# Patient Record
Sex: Female | Born: 1950 | Race: White | Hispanic: No | Marital: Married | Smoking: Never smoker
Health system: Southern US, Community
[De-identification: ages and names within clinical notes are randomized; demographics above are authoritative.]

## PROBLEM LIST (undated history)

## (undated) DIAGNOSIS — K649 Unspecified hemorrhoids: Secondary | ICD-10-CM

## (undated) DIAGNOSIS — R112 Nausea with vomiting, unspecified: Secondary | ICD-10-CM

## (undated) DIAGNOSIS — E039 Hypothyroidism, unspecified: Secondary | ICD-10-CM

## (undated) DIAGNOSIS — Z9889 Other specified postprocedural states: Secondary | ICD-10-CM

## (undated) DIAGNOSIS — I10 Essential (primary) hypertension: Secondary | ICD-10-CM

## (undated) DIAGNOSIS — R002 Palpitations: Secondary | ICD-10-CM

## (undated) DIAGNOSIS — J189 Pneumonia, unspecified organism: Secondary | ICD-10-CM

## (undated) DIAGNOSIS — F419 Anxiety disorder, unspecified: Secondary | ICD-10-CM

## (undated) HISTORY — PX: RIGHT OOPHORECTOMY: SHX2359

## (undated) HISTORY — PX: OTHER SURGICAL HISTORY: SHX169

## (undated) HISTORY — PX: TONSILLECTOMY: SUR1361

---

## 1998-06-16 ENCOUNTER — Other Ambulatory Visit: Admission: RE | Admit: 1998-06-16 | Discharge: 1998-06-16 | Payer: Self-pay | Admitting: Obstetrics & Gynecology

## 1999-06-19 ENCOUNTER — Other Ambulatory Visit: Admission: RE | Admit: 1999-06-19 | Discharge: 1999-06-19 | Payer: Self-pay | Admitting: Obstetrics & Gynecology

## 1999-10-20 ENCOUNTER — Encounter (INDEPENDENT_AMBULATORY_CARE_PROVIDER_SITE_OTHER): Payer: Self-pay

## 1999-10-20 ENCOUNTER — Inpatient Hospital Stay (HOSPITAL_COMMUNITY): Admission: AD | Admit: 1999-10-20 | Discharge: 1999-10-20 | Payer: Self-pay | Admitting: Obstetrics and Gynecology

## 1999-10-23 ENCOUNTER — Encounter (INDEPENDENT_AMBULATORY_CARE_PROVIDER_SITE_OTHER): Payer: Self-pay | Admitting: Specialist

## 1999-10-23 ENCOUNTER — Other Ambulatory Visit: Admission: RE | Admit: 1999-10-23 | Discharge: 1999-10-23 | Payer: Self-pay | Admitting: Obstetrics & Gynecology

## 2000-06-20 ENCOUNTER — Other Ambulatory Visit: Admission: RE | Admit: 2000-06-20 | Discharge: 2000-06-20 | Payer: Self-pay | Admitting: Obstetrics & Gynecology

## 2001-04-17 ENCOUNTER — Encounter: Payer: Self-pay | Admitting: Internal Medicine

## 2001-04-17 ENCOUNTER — Ambulatory Visit (HOSPITAL_COMMUNITY): Admission: RE | Admit: 2001-04-17 | Discharge: 2001-04-17 | Payer: Self-pay | Admitting: Internal Medicine

## 2001-04-27 ENCOUNTER — Encounter: Payer: Self-pay | Admitting: Plastic Surgery

## 2001-04-27 ENCOUNTER — Ambulatory Visit (HOSPITAL_COMMUNITY): Admission: RE | Admit: 2001-04-27 | Discharge: 2001-04-27 | Payer: Self-pay | Admitting: Plastic Surgery

## 2001-06-22 ENCOUNTER — Other Ambulatory Visit: Admission: RE | Admit: 2001-06-22 | Discharge: 2001-06-22 | Payer: Self-pay | Admitting: Obstetrics & Gynecology

## 2001-08-17 ENCOUNTER — Inpatient Hospital Stay (HOSPITAL_COMMUNITY): Admission: RE | Admit: 2001-08-17 | Discharge: 2001-08-18 | Payer: Self-pay | Admitting: Family Medicine

## 2001-08-18 ENCOUNTER — Encounter: Payer: Self-pay | Admitting: Family Medicine

## 2001-08-20 ENCOUNTER — Encounter: Payer: Self-pay | Admitting: Cardiology

## 2001-08-20 ENCOUNTER — Ambulatory Visit (HOSPITAL_COMMUNITY): Admission: RE | Admit: 2001-08-20 | Discharge: 2001-08-20 | Payer: Self-pay | Admitting: Cardiology

## 2001-09-15 ENCOUNTER — Encounter: Payer: Self-pay | Admitting: Neurology

## 2001-09-15 ENCOUNTER — Ambulatory Visit (HOSPITAL_COMMUNITY): Admission: RE | Admit: 2001-09-15 | Discharge: 2001-09-15 | Payer: Self-pay | Admitting: Neurology

## 2001-12-03 ENCOUNTER — Ambulatory Visit (HOSPITAL_COMMUNITY): Admission: RE | Admit: 2001-12-03 | Discharge: 2001-12-03 | Payer: Self-pay | Admitting: Family Medicine

## 2001-12-03 ENCOUNTER — Encounter: Payer: Self-pay | Admitting: Family Medicine

## 2001-12-10 ENCOUNTER — Ambulatory Visit (HOSPITAL_COMMUNITY): Admission: RE | Admit: 2001-12-10 | Discharge: 2001-12-10 | Payer: Self-pay | Admitting: Internal Medicine

## 2002-01-21 ENCOUNTER — Encounter: Payer: Self-pay | Admitting: Family Medicine

## 2002-01-21 ENCOUNTER — Ambulatory Visit (HOSPITAL_COMMUNITY): Admission: RE | Admit: 2002-01-21 | Discharge: 2002-01-21 | Payer: Self-pay | Admitting: Family Medicine

## 2002-02-16 ENCOUNTER — Ambulatory Visit (HOSPITAL_COMMUNITY): Admission: RE | Admit: 2002-02-16 | Discharge: 2002-02-16 | Payer: Self-pay | Admitting: Internal Medicine

## 2002-02-16 ENCOUNTER — Encounter (INDEPENDENT_AMBULATORY_CARE_PROVIDER_SITE_OTHER): Payer: Self-pay | Admitting: Internal Medicine

## 2002-03-02 ENCOUNTER — Ambulatory Visit (HOSPITAL_COMMUNITY): Admission: RE | Admit: 2002-03-02 | Discharge: 2002-03-02 | Payer: Self-pay | Admitting: Internal Medicine

## 2002-03-02 ENCOUNTER — Encounter (INDEPENDENT_AMBULATORY_CARE_PROVIDER_SITE_OTHER): Payer: Self-pay | Admitting: Internal Medicine

## 2002-03-24 ENCOUNTER — Ambulatory Visit (HOSPITAL_COMMUNITY): Admission: RE | Admit: 2002-03-24 | Discharge: 2002-03-24 | Payer: Self-pay | Admitting: Internal Medicine

## 2002-03-24 ENCOUNTER — Encounter (INDEPENDENT_AMBULATORY_CARE_PROVIDER_SITE_OTHER): Payer: Self-pay | Admitting: Internal Medicine

## 2002-05-05 ENCOUNTER — Ambulatory Visit (HOSPITAL_COMMUNITY): Admission: RE | Admit: 2002-05-05 | Discharge: 2002-05-05 | Payer: Self-pay | Admitting: Internal Medicine

## 2002-06-28 ENCOUNTER — Other Ambulatory Visit: Admission: RE | Admit: 2002-06-28 | Discharge: 2002-06-28 | Payer: Self-pay | Admitting: Obstetrics & Gynecology

## 2002-09-07 ENCOUNTER — Ambulatory Visit (HOSPITAL_COMMUNITY): Admission: RE | Admit: 2002-09-07 | Discharge: 2002-09-07 | Payer: Self-pay | Admitting: Internal Medicine

## 2002-09-07 ENCOUNTER — Encounter (INDEPENDENT_AMBULATORY_CARE_PROVIDER_SITE_OTHER): Payer: Self-pay | Admitting: Internal Medicine

## 2002-12-25 ENCOUNTER — Emergency Department (HOSPITAL_COMMUNITY): Admission: EM | Admit: 2002-12-25 | Discharge: 2002-12-25 | Payer: Self-pay | Admitting: *Deleted

## 2002-12-25 ENCOUNTER — Encounter: Payer: Self-pay | Admitting: *Deleted

## 2002-12-27 ENCOUNTER — Ambulatory Visit (HOSPITAL_COMMUNITY): Admission: RE | Admit: 2002-12-27 | Discharge: 2002-12-27 | Payer: Self-pay | Admitting: Family Medicine

## 2002-12-27 ENCOUNTER — Encounter: Payer: Self-pay | Admitting: Family Medicine

## 2003-05-17 ENCOUNTER — Ambulatory Visit (HOSPITAL_COMMUNITY): Admission: RE | Admit: 2003-05-17 | Discharge: 2003-05-17 | Payer: Self-pay | Admitting: Internal Medicine

## 2003-05-17 ENCOUNTER — Encounter (INDEPENDENT_AMBULATORY_CARE_PROVIDER_SITE_OTHER): Payer: Self-pay | Admitting: Internal Medicine

## 2003-08-30 ENCOUNTER — Other Ambulatory Visit: Admission: RE | Admit: 2003-08-30 | Discharge: 2003-08-30 | Payer: Self-pay | Admitting: Obstetrics & Gynecology

## 2003-10-27 ENCOUNTER — Ambulatory Visit (HOSPITAL_COMMUNITY): Admission: RE | Admit: 2003-10-27 | Discharge: 2003-10-27 | Payer: Self-pay | Admitting: Family Medicine

## 2004-03-02 ENCOUNTER — Encounter: Admission: RE | Admit: 2004-03-02 | Discharge: 2004-03-02 | Payer: Self-pay | Admitting: Orthopedic Surgery

## 2004-03-21 ENCOUNTER — Encounter: Admission: RE | Admit: 2004-03-21 | Discharge: 2004-03-21 | Payer: Self-pay | Admitting: Orthopedic Surgery

## 2004-04-06 ENCOUNTER — Encounter: Admission: RE | Admit: 2004-04-06 | Discharge: 2004-04-06 | Payer: Self-pay | Admitting: Orthopedic Surgery

## 2004-05-16 ENCOUNTER — Encounter (HOSPITAL_COMMUNITY): Admission: RE | Admit: 2004-05-16 | Discharge: 2004-06-15 | Payer: Self-pay | Admitting: Orthopedic Surgery

## 2004-06-18 ENCOUNTER — Encounter (HOSPITAL_COMMUNITY): Admission: RE | Admit: 2004-06-18 | Discharge: 2004-07-18 | Payer: Self-pay | Admitting: Orthopedic Surgery

## 2004-08-31 ENCOUNTER — Other Ambulatory Visit: Admission: RE | Admit: 2004-08-31 | Discharge: 2004-08-31 | Payer: Self-pay | Admitting: Obstetrics & Gynecology

## 2004-10-03 ENCOUNTER — Ambulatory Visit: Payer: Self-pay | Admitting: *Deleted

## 2004-10-17 ENCOUNTER — Ambulatory Visit (HOSPITAL_COMMUNITY): Admission: RE | Admit: 2004-10-17 | Discharge: 2004-10-17 | Payer: Self-pay | Admitting: *Deleted

## 2004-10-17 ENCOUNTER — Ambulatory Visit: Payer: Self-pay | Admitting: *Deleted

## 2004-10-22 ENCOUNTER — Ambulatory Visit: Payer: Self-pay | Admitting: *Deleted

## 2005-04-11 ENCOUNTER — Ambulatory Visit (HOSPITAL_COMMUNITY): Admission: RE | Admit: 2005-04-11 | Discharge: 2005-04-11 | Payer: Self-pay | Admitting: Family Medicine

## 2005-10-02 ENCOUNTER — Other Ambulatory Visit: Admission: RE | Admit: 2005-10-02 | Discharge: 2005-10-02 | Payer: Self-pay | Admitting: Obstetrics & Gynecology

## 2006-01-28 ENCOUNTER — Ambulatory Visit (HOSPITAL_COMMUNITY): Admission: RE | Admit: 2006-01-28 | Discharge: 2006-01-28 | Payer: Self-pay | Admitting: Family Medicine

## 2008-06-20 ENCOUNTER — Encounter (INDEPENDENT_AMBULATORY_CARE_PROVIDER_SITE_OTHER): Payer: Self-pay | Admitting: Obstetrics & Gynecology

## 2008-06-20 ENCOUNTER — Ambulatory Visit (HOSPITAL_COMMUNITY): Admission: RE | Admit: 2008-06-20 | Discharge: 2008-06-20 | Payer: Self-pay | Admitting: Obstetrics & Gynecology

## 2008-07-29 ENCOUNTER — Ambulatory Visit (HOSPITAL_COMMUNITY): Admission: RE | Admit: 2008-07-29 | Discharge: 2008-07-29 | Payer: Self-pay | Admitting: Family Medicine

## 2008-09-07 ENCOUNTER — Encounter (HOSPITAL_COMMUNITY): Admission: RE | Admit: 2008-09-07 | Discharge: 2008-09-29 | Payer: Self-pay | Admitting: Physician Assistant

## 2009-03-24 ENCOUNTER — Encounter (INDEPENDENT_AMBULATORY_CARE_PROVIDER_SITE_OTHER): Payer: Self-pay | Admitting: *Deleted

## 2009-03-24 LAB — CONVERTED CEMR LAB
ALT: 14 units/L
Calcium: 9.2 mg/dL
Free T4: 5.7 ng/dL
Glucose, Bld: 89 mg/dL
Potassium: 3.7 meq/L
Sodium: 139 meq/L
T3, Free: 72.5 pg/mL
TSH: 2.705 microintl units/mL
Total Protein: 7 g/dL

## 2010-01-22 ENCOUNTER — Encounter (INDEPENDENT_AMBULATORY_CARE_PROVIDER_SITE_OTHER): Payer: Self-pay | Admitting: *Deleted

## 2010-01-22 DIAGNOSIS — F411 Generalized anxiety disorder: Secondary | ICD-10-CM | POA: Insufficient documentation

## 2010-01-22 DIAGNOSIS — Z8679 Personal history of other diseases of the circulatory system: Secondary | ICD-10-CM | POA: Insufficient documentation

## 2010-01-24 ENCOUNTER — Ambulatory Visit: Payer: Self-pay | Admitting: Cardiology

## 2010-01-24 DIAGNOSIS — I471 Supraventricular tachycardia, unspecified: Secondary | ICD-10-CM | POA: Insufficient documentation

## 2010-01-26 ENCOUNTER — Encounter: Payer: Self-pay | Admitting: Cardiology

## 2010-01-26 ENCOUNTER — Ambulatory Visit: Payer: Self-pay | Admitting: Cardiology

## 2010-01-26 ENCOUNTER — Ambulatory Visit (HOSPITAL_COMMUNITY): Admission: RE | Admit: 2010-01-26 | Discharge: 2010-01-26 | Payer: Self-pay | Admitting: Cardiology

## 2010-02-06 ENCOUNTER — Telehealth (INDEPENDENT_AMBULATORY_CARE_PROVIDER_SITE_OTHER): Payer: Self-pay

## 2010-03-01 ENCOUNTER — Encounter: Payer: Self-pay | Admitting: Cardiology

## 2010-03-05 ENCOUNTER — Encounter: Payer: Self-pay | Admitting: Cardiology

## 2010-03-07 ENCOUNTER — Ambulatory Visit: Payer: Self-pay | Admitting: Cardiology

## 2010-12-08 ENCOUNTER — Encounter: Payer: Self-pay | Admitting: Family Medicine

## 2010-12-09 ENCOUNTER — Encounter: Payer: Self-pay | Admitting: Orthopedic Surgery

## 2010-12-18 NOTE — Miscellaneous (Signed)
Summary: labs cmp,t4,tsh,03/24/2009  Clinical Lists Changes  Observations: Added new observation of CALCIUM: 9.2 mg/dL (16/08/9603 54:09) Added new observation of ALBUMIN: 4.7 g/dL (81/19/1478 29:56) Added new observation of PROTEIN, TOT: 7.0 g/dL (21/30/8657 84:69) Added new observation of SGPT (ALT): 14 units/L (03/24/2009 11:23) Added new observation of SGOT (AST): 19 units/L (03/24/2009 11:23) Added new observation of ALK PHOS: 38 units/L (03/24/2009 11:23) Added new observation of CREATININE: 0.69 mg/dL (62/95/2841 32:44) Added new observation of BUN: 11 mg/dL (11/20/7251 66:44) Added new observation of BG RANDOM: 89 mg/dL (03/47/4259 56:38) Added new observation of CO2 PLSM/SER: 26 meq/L (03/24/2009 11:23) Added new observation of CL SERUM: 102 meq/L (03/24/2009 11:23) Added new observation of K SERUM: 3.7 meq/L (03/24/2009 11:23) Added new observation of NA: 139 meq/L (03/24/2009 11:23) Added new observation of TSH: 2.705 microintl units/mL (03/24/2009 11:23) Added new observation of T4, FREE: 5.7 ng/dL (75/64/3329 51:88) Added new observation of T3 FREE: 72.5 pg/mL (03/24/2009 11:23)

## 2010-12-18 NOTE — Procedures (Signed)
Summary: life watch  life watch   Imported By: Faythe Ghee 03/01/2010 14:49:59  _____________________________________________________________________  External Attachment:    Type:   Image     Comment:   External Document  Appended Document: life watch Reviewed - sinus rhythm without other specific arrhythmias.

## 2010-12-18 NOTE — Assessment & Plan Note (Signed)
Summary: 1 mth f/u per checkout on 01/24/10/tg   Visit Type:  Follow-up Primary Provider:  Dr. Assunta Found   History of Present Illness: 60 year old woman presents for a followup visit. She still reports occasional palpitations, although no prolonged episodes. She thinks it may be related to one of the medications that she is taking, and has been experimenting by stopping some of her supplements temporarily and adjusting her thyroid dose. I asked her to review this further with Dr. Phillips Odor.  LifeWatch monitor demonstrated sinus rhythm with no definite dysrhythmias or concerning heart rate variability. I reviewed this with her today. Her echocardiogram was also reassuring, showing normal LVEF, and no major valvular abnormalities.  Studies are overall reassuring. At this point would not pursue any further cardiac testing, unless symptoms worsen. She was comfortable with this.  Current Medications (verified): 1)  Daily Multi  Tabs (Multiple Vitamins-Minerals) .... Take 1 Tab Daily 2)  Vitamin C Cr 1500 Mg Cr-Tabs (Ascorbic Acid) .... Take 1 Tab Daily 3)  Estrace 0.1 Mg/gm Crea (Estradiol) .... Once Daily 4)  Valium 5 Mg Tabs (Diazepam) .... Take Prn 5)  Prometrium 100 Mg Caps (Progesterone Micronized) .... Take 1 Tablet By Mouth Once A Day 6)  Dhea 10 Mg Caps (Prasterone (Dhea)) .... Take 1 Tab Daily 7)  Vitamin B-12 100 Mcg Tabs (Cyanocobalamin) .... Take 1 Tab Daily 8)  Vitamin D 400 Unit Tabs (Cholecalciferol) .... Take 1 Tab Daily 9)  Oscal 500/200 D-3 500-200 Mg-Unit Tabs (Calcium-Vitamin D) .... Take 1 Tab Daily 10)  Aminocaproic Acid 500 Mg Tabs (Aminocaproic Acid) .... Take 1 Tab Daily 11)  Biotin 800 Mcg Tabs (Biotin) .... Take 1 Tab Daily 12)  Fish Oil 1000 Mg Caps (Omega-3 Fatty Acids) .... Take 1 Cap Daily 13)  Enterogenic Concentrate  Caps (Probiotic Product) .... Take 1 Tab Daily 14)  Pa Resveratrol 250 Mg Caps (Resveratrol) .... Take 1 Tab 2 Times Weekly  Allergies  (verified): 1)  ! 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         Otherwise reviewed and negative.  Vital Signs:  Patient profile:   60 year old female Height:      67 inches Weight:      134 pounds O2 Sat:      98 % on Room air Pulse rate:   85 / minute BP sitting:   134 / 82  (left arm)  Vitals Entered By: Dreama Saa, CNA (March 07, 2010 11:53 AM)  O2 Flow:  Room air  Physical Exam  Additional Exam:  Thin woman, appearing younger than stated age. HEENT: Conjunctiva and lids normal, oropharynx clear. Neck: Supple, no carotid bruits, well-healed scar on the right, no thyroid tenderness. Lungs: Clear to auscultation, nonlabored. Cardiac: Regular rate and rhythm, split S1, no S3 gallop or pericardial rub. No obvious midsystolic click. Abdomen: Soft, nontender, bowel sounds present. Skin: Warm and dry. Extremities: No pitting edema, distal pulses 2+. Musculoskeletal: No gross deformities. Neuropsychiatric: Alert and oriented x3, affect appropriate.   Echocardiogram  Procedure date:  01/26/2010  Findings:       Left ventricle: The cavity size was  normal. Wall thickness was   increased in a pattern of mild LVH. The estimated ejection fraction   was 60%. Wall motion was normal; there were no regional wall motion   abnormalities.    --------------------------------------------------------------------   Aortic valve: Structurally normal valve. Cusp separation was normal.   Doppler: Transvalvular velocity was within the normal range. There   was no stenosis. No regurgitation.     --------------------------------------------------------------------   Mitral valve: Structurally normal valve. Leaflet separation was   normal. Doppler: Transvalvular velocity was within the normal range.   There was no evidence for stenosis. No regurgitation.  Peak   gradient: 3mm Hg (D).    --------------------------------------------------------------------   Left atrium: The atrium was normal in size.    --------------------------------------------------------------------   Right ventricle: The cavity size was normal. Systolic function was   normal.    --------------------------------------------------------------------   Pulmonic valve: Poorly visualized.    --------------------------------------------------------------------   Tricuspid valve: Structurally normal valve. Leaflet separation was   normal. Doppler: Transvalvular velocity was within the normal range.   No regurgitation.    --------------------------------------------------------------------   Right atrium: The atrium was at the upper limits of normal in size.    --------------------------------------------------------------------   Pericardium: There was no pericardial effusion.   Impression & Recommendations:  Problem # 1:  PALPITATIONS, HX OF (ICD-V12.50)  Echocardiogram demonstrates normal cardiac structure and function, and LifeWatch monitor did not demonstrate any definite dysrhythmias. At this point would recommend observation. She will continue to follow with Dr. Phillips Odor, considering other medication dose adjustments. No further cardiac testing is planned now, although can be reconsidered if she manifests any progressive symptoms, particularly a prolonged episode of rapid palpitations. Her follow up will be p.r.n.  Patient Instructions: 1)  Your physician recommends that you schedule a follow-up appointment in: as needed  2)  Your physician recommends that you continue on your current medications as  directed. Please refer to the Current Medication list given to you today.

## 2010-12-18 NOTE — Letter (Signed)
Summary: [place] Results Engineer, agricultural at [facility]  [address], [place] [address]   Phone: [phone]  Fax: [phone]      January 26, 2010 MRN: [id number]   [facility] [place] [address] [place], [place]  [address]   Dear Ms. Janice Durham,  Your test ordered by Selena Batten has been reviewed by your physician (or physician assistant) and was found to be normal or stable. Your physician (or physician assistant) felt no changes were needed at this time.  __X__ Echocardiogram  ____ Cardiac Stress Test  ____ Lab Work  ____ Peripheral vascular study of arms, legs or neck  ____ CT scan or X-ray  ____ Lung or Breathing test  ____ Other: Please continue on current medical treatment.  Thank you.  Nona Dell, MD, F.A.C.C

## 2010-12-18 NOTE — Letter (Signed)
Summary: [place] Results Engineer, agricultural at [facility]  [address], [place] [address]   Phone: [phone]  Fax: [phone]      March 05, 2010 MRN: [id number]   [facility] [place] [address] [place], [place]  [address]   Dear Ms. Riccobono,  Your test ordered by Selena Batten has been reviewed by your physician (or physician assistant) and was found to be normal or stable. Your physician (or physician assistant) felt no changes were needed at this time.  ____ Echocardiogram  ____ Cardiac Stress Test  ____ Lab Work  ____ Peripheral vascular study of arms, legs or neck  ____ CT scan or X-ray  ____ Lung or Breathing test  __X__ Other: Event Monitor Please continue on current medical treatment.  Thank you.  Nona Dell, MD, F.A.C.C

## 2010-12-18 NOTE — Progress Notes (Signed)
**Note De-identified Janice Durham Obfuscation**  **Note De-Identified Janice Durham Obfuscation** Phone Note Outgoing Call   Call placed by: Larita Fife Kyliegh Jester LPN,  February 06, 2010 1:19 PM Summary of Call: Received letter from [facility] stating that they have tried daily to reach the pt. by telephone without success. I called and left message for pt. to call office concerning this matter.   Follow-up for Phone Call        Pt is having a special outing this weekend, spoke with lifewatch and will begin monitoring on Sunday, February 11, 2010 Follow-up by: Teressa Lower RN,  February 06, 2010 2:09 PM

## 2010-12-18 NOTE — Letter (Signed)
Summary: PROGRESS NOTES  PROGRESS NOTES   Imported By: Faythe Ghee 01/24/2010 16:54:45  _____________________________________________________________________  External Attachment:    Type:   Image     Comment:   External Document

## 2010-12-18 NOTE — Assessment & Plan Note (Signed)
Summary: ***NP6 PALPITATIONS   Visit Type:  Initial Consult Primary Provider:  Dr. Assunta Found   History of Present Illness: 60 year old woman seen by Dr. Dorethea Clan in our practice several years ago. She is referred to the office with a long-standing history of recurrent palpitations. Previous cardiac testing was reviewed, and is outlined below.   Ms. Reino indicates typically nocturnal palpitations, described as a rapid heartbeat, sometimes waking her up, and typically lasting no more than 5 minutes. She does admit to trouble sleeping, and has to take Valium in the evening. She also relates that she has been taking Armour Thyroid for hypothyroidism, and noticed a worsening in palpitations on that medication. This was stopped in January.   She has had no syncope with her palpitations, and otherwise with activity she experiences no symptoms.   There is an equivocal history of mitral valve prolapse, not confirmed by her last echocardiogram.  I also see a previous diagnosis of long RP tachycardia, although can find no confirmatory telemetry to support this.  Current Medications (verified): 1)  Daily Multi  Tabs (Multiple Vitamins-Minerals) .... Take 1 Tab Daily 2)  Vitamin C Cr 1500 Mg Cr-Tabs (Ascorbic Acid) .... Take 1 Tab Daily 3)  Estroven  Tabs (Nutritional Supplements) .... Take 1 Tab Daily 4)  Valium 5 Mg Tabs (Diazepam) .... Take Prn 5)  Est Estrogens-Methyltest 1.25-2.5 Mg Tabs (Est Estrogens-Methyltest) .... Take 1 Tab Daily 6)  First-Progesterone Vgs 100 100 Mg Supp (Progesterone) .... Take 1 Tab Two Times Weekly 7)  Dhea 10 Mg Caps (Prasterone (Dhea)) .... Take 1 Tab Daily 8)  Vitamin B-12 100 Mcg Tabs (Cyanocobalamin) .... Take 1 Tab Daily 9)  Vitamin D 400 Unit Tabs (Cholecalciferol) .... Take 1 Tab Daily 10)  Oscal 500/200 D-3 500-200 Mg-Unit Tabs (Calcium-Vitamin D) .... Take 1 Tab Daily 11)  Aminocaproic Acid 500 Mg Tabs (Aminocaproic Acid) .... Take 1 Tab Daily 12)   Biotin 800 Mcg Tabs (Biotin) .... Take 1 Tab Daily 13)  Fish Oil 1000 Mg Caps (Omega-3 Fatty Acids) .... Take 1 Cap Daily 14)  Enterogenic Concentrate  Caps (Probiotic Product) .... Take 1 Tab Daily 15)  Zinc 100 Mg Tabs (Zinc) .... Take 1 Tab 2 Times Weekly 16)  Pa Resveratrol 250 Mg Caps (Resveratrol) .... Take 1 Tab 2 Times Weekly  Allergies (verified): 1)  ! 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   DATE OF BIRTH:  Jul 02, 1951   DATE OF PROCEDURE:  10/17/2004  DATE OF DISCHARGE:                                  ECHOCARDIOGRAM  PRIMARY CARE PHYSICIAN:  Dr. Phillips Odor.   TAPE NUMBER:  [id number].   TAPE COUNT:  [id number].   HISTORY OF PRESENT ILLNESS:  This is a 60 year old woman with chest  discomfort.  A previous echocardiogram done in October 2002 was completely  normal.   DESCRIPTION OF PROCEDURE:  The technical quality of the study is adequate.  M-mode tracing the aorta is 27 mm.   The left atrium is 28 mm.   The septum is 10 mm.   The posterior wall is 8 mm.   Left ventricular diastolic dimension is 40 mm.   Left ventricular systolic dimension is 29 mm.   2-D AND DOPPLER IMAGING:  The left ventricle is normal size with normal  systolic function.  There are no wall motion abnormalities seen.  Estimated  ejection fraction is 55-60%.   The right ventricle  is normal size with normal systolic function.   Both atria are normal size.   The aortic valve is slightly sclerotic with trivial insufficiency.  No  stenosis is seen.   The mitral valve is morphologically unremarkable with no evidence of  prolapse.  There is no regurgitation or stenosis seen.   The tricuspid valve has trace regurgitation.   The pulmonic valve was not well seen.   There is no pericardial effusion.   The inferior vena cava is normal size.   The ascending aorta is not well seen.     Trey Paula (10/17/2004)  Stress Echocardiogram Stress Echocardiogram  RESULTS OF THE STRESS TEST:  The patient exercised 12 minutes and 44 seconds  of her Bruce protocol attaining 12.8 METS of exercise.  Her heart rate  increased from 110 beats/minute to 160 beats/minute, which is 97% of her  maximum predicted heart rate for her age.  Her blood pressure went from  120/78 to 160/90, giving a double product of 23.6000.  During that time she  had mild chest discomfort, which resolved relatively quickly.  She had a few  PVCs but there was no ischemic ST-T wave changes.   IMAGES:  Rest images.  The left ventricular size and function are normal.  There is no significant wall motion abnormality.  Estimated ejection  fraction is 55-60%.   Post stress images reveal appropriate augmentation of left ventricular  systolic function.  There are no exercise induced wall motion abnormalities.  The cavity gets appropriately smaller with exercise.   INTERPRETATION:  This is an extremely low risk scan in a patient with no  known coronary artery disease.  Her exercise performance and her left  ventricular systolic function appear to be normal. (10/17/2004)    Review of Systems  The patient denies anorexia, fever, weight loss, chest pain, syncope, dyspnea on exertion, peripheral edema, prolonged cough, headaches, hemoptysis, abdominal pain, melena, hematochezia, and severe indigestion/heartburn.          Otherwise reviewed and negative.  Vital Signs:  Patient profile:   60 year old female Height:      67 inches Weight:      140 pounds BMI:     22.01 Pulse rate:   81 / minute BP sitting:   156 / 81  (right arm)  Vitals Entered By: Dreama Saa, CNA (January 24, 2010 2:10 PM)  Physical Exam  Additional Exam:  Thin woman, appearing younger than stated age. HEENT: Conjunctiva and lids normal, oropharynx clear. Neck: Supple, no carotid bruits, well-healed scar on the right, no thyroid tenderness. Lungs: Clear to auscultation, nonlabored. Cardiac: Regular rate and  rhythm, split S1, no S3 gallop or pericardial rub. No obvious midsystolic click. Abdomen: Soft, nontender, bowel sounds present. Skin: Warm and dry. Extremities: No pitting edema, distal pulses 2+. Musculoskeletal: No gross deformities. Neuropsychiatric: Alert and oriented x3, affect appropriate.   EKG  Procedure date:  01/24/2010  Findings:      Normal sinus rhythm at 90 beats per minutes, left atrial enlargement, nonspecific T-wave changes, minor R prime in V1 and V2.  Impression & Recommendations:  Problem # 1:  PALPITATIONS, HX OF (ICD-V12.50)  Long-standing history of intermittent palpitations dating back over many years. She reports typically nocturnal palpitations as detailed above, no associated dizziness or syncope, and no exertional component during the daytime hours. Old cardiac records were reviewed, with a vague reference to long RP tachycardia, although no confirmatory telemetry available for review. The patient recalls wearing cardiac monitors several years ago. She remains concerned about her symptoms given the recent worsening on Armour Thyroid. We discussed placing a 7 day CardioNet monitor, and also reassessing cardiac structure and function in light of her electrocardiogram and questionable history of mitral valve prolapse, with a 2-D echocardiogram. I will then have her follow up over the next month  for review. No medications added today.  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 I am not certain how well this was ever documented. Hopefully the repeat cardiac monitor will be revealing.  Orders: Cardionet/Event Monitor (Cardionet/Event) 2-D Echocardiogram (2D Echo)  Patient Instructions: 1)  Your physician recommends that you schedule a follow-up appointment in: 79month 2)  Your physician has requested that you have an echocardiogram.  Echocardiography is a painless test that uses sound waves to create images of your heart. It provides your doctor with information about the size and shape of your heart and how well your heart's chambers and valves are working.  This procedure takes approximately one hour. There are no restrictions for this procedure. 3)  Your physician has recommended that you wear an event monitor.  Event monitors are medical devices that record the heart's electrical activity. Doctors most often use these monitors to diagnose arrhythmias. Arrhythmias are problems with the speed or rhythm of the heartbeat. The monitor is a small, portable device. You can wear one while you do your normal daily activities. This is usually used to diagnose what is causing palpitations/syncope (passing out).

## 2011-04-02 NOTE — Op Note (Signed)
NAME:  Janice Durham, Janice Durham               ACCOUNT NO.:  [id number]   MEDICAL RECORD NO.:  [id number]          PATIENT TYPE:  AMB   LOCATION:  SDC                           FACILITY:  [facility]   PHYSICIAN:  Ilda Mori, M.D.   DATE OF BIRTH:  [birth year]   DATE OF PROCEDURE:  06/20/2008  DATE OF DISCHARGE:                               OPERATIVE REPORT   PREOPERATIVE DIAGNOSES:  1. Endometrial polyp.  2. Breakthrough bleeding on hormonal replacement therapy.   POSTOPERATIVE DIAGNOSES:  1. Endometrial polyp.  2. Breakthrough bleeding on hormonal replacement therapy.   PROCEDURES:  1. Hysteroscopy.  2. Dilation and curettage.  3. Endometrial polypectomy.   SURGEON:  Ilda Mori, MD   ANESTHESIA:  General.   ESTIMATED BLOOD LOSS:  Minimal.   FINDINGS:  A 1-cm endometrial polyp.   SPECIMENS:  Endometrial curettings and endometrial polyp.   COMPLICATIONS:  None.   INDICATIONS:  This is a 60 year old gravida 2, para 2 female who  developed severe menopausal symptoms requiring hormone therapy.  While  on hormonal therapy, the patient experienced significant amount of  breakthrough bleeding.  An uterine ultrasound was performed, which  demonstrated the presence of what appeared to be a 1-cm endometrial  polyp and a thickened endometrium.  Based on these findings, the patient  was encouraged to undergo a hysteroscopy and D&C to remove the polyp and  to rule out the endometrial carcinoma.   PROCEDURE:  The patient was taken to the operating room and general  anesthesia was induced.  She was placed in the dorsal lithotomy  position.  The vagina and perineum were prepped and draped in a sterile  fashion.  The anterior lip of the cervix was grasped with single-tooth  tenaculum and 10 mL of 2% lidocaine was infiltrated in the paracervical  tissues.  The uterus sounded to 8 cm.  The endocervix was dilated with  the [facility] dilators to a 23-French.  A diagnostic hysteroscope was  introduced  and a small endometrial polyp was identified at the left  fundus.  The hysteroscope was removed.  The polyp forceps were  introduced and the endometrial polyp was grasped and  removed.  The hysteroscope was reintroduced to confirm that the polyp  had been removed.  A D&C was then performed to remove any remaining  abnormal tissue, and the procedure was then terminated.  The patient  tolerated the procedure well and left the operating room in pretty good  condition.      Ilda Mori, M.D.  Electronically Signed     RK/MEDQ  D:  06/20/2008  T:  06/20/2008  Job:  [id number]

## 2011-04-05 NOTE — Procedures (Signed)
NAME:  Janice Durham, Janice Durham               ACCOUNT NO.:  [id number]   MEDICAL RECORD NO.:  [id number]          PATIENT TYPE:  OUT   LOCATION:  RAD                           FACILITY:  [facility]   PHYSICIAN:  Vida Roller, M.D.   DATE OF BIRTH:  11-Jul-1951   DATE OF PROCEDURE:  10/17/2004  DATE OF DISCHARGE:                                  ECHOCARDIOGRAM   PRIMARY CARE PHYSICIAN:  Dr. Phillips Odor.   TAPE NUMBER:  [id number].   TAPE COUNT:  [id number].   HISTORY OF PRESENT ILLNESS:  This is a 60 year old woman with chest  discomfort.  This is an ischemic evaluation.   DETAILS OF THE PROCEDURE:  The patient was brought to the echocardiographic  lab where she was placed in the left lateral decubitus position.  Images  were obtained in the apical 4 apical 2 parasternal lung, parasternal short  axis views.  These views were kept on cine' loop format, as well as tape  format.  The patient then exercised in a Bruce protocol and when she had  obtained at least 85% of her maximum predicted heart rate for her age, she  was immediately placed back in the left lateral decubitus position and  echocardiographic images were again obtained in the same positions.  These  views were used to compare with the rest of the views to assess whether or  not there was not was any exercise induced ischemia or wall motion  abnormality.   RESULTS OF THE STRESS TEST:  The patient exercised 12 minutes and 44 seconds  of her Bruce protocol attaining 12.8 METS of exercise.  Her heart rate  increased from 110 beats/minute to 160 beats/minute, which is 97% of her  maximum predicted heart rate for her age.  Her blood pressure went from  120/78 to 160/90, giving a double product of 23.6000.  During that time she  had mild chest discomfort, which resolved relatively quickly.  She had a few  PVCs but there was no ischemic ST-T wave changes.   IMAGES:  Rest images.  The left ventricular size and function are normal.  There is no  significant wall motion abnormality.  Estimated ejection  fraction is 55-60%.   Post stress images reveal appropriate augmentation of left ventricular  systolic function.  There are no exercise induced wall motion abnormalities.  The cavity gets appropriately smaller with exercise.   INTERPRETATION:  This is an extremely low risk scan in a patient with no  known coronary artery disease.  Her exercise performance and her left  ventricular systolic function appear to be normal.    Trey Paula  JH/MEDQ  D:  10/17/2004  T:  10/17/2004  Job:  [id number]

## 2011-04-05 NOTE — Procedures (Signed)
NAME:  Janice Durham, Janice Durham               ACCOUNT NO.:  [id number]   MEDICAL RECORD NO.:  [id number]          PATIENT TYPE:  OUT   LOCATION:  RAD                           FACILITY:  [facility]   PHYSICIAN:  Vida Roller, M.D.   DATE OF BIRTH:  Jun 16, 1951   DATE OF PROCEDURE:  10/17/2004  DATE OF DISCHARGE:                                    STRESS TEST   PROCEDURE:  Stress echocardiogram.   CARDIOLOGIST:  Vida Roller, M.D.   INDICATIONS FOR PROCEDURE:  The patient is a 60 year old female with  no  known coronary artery disease and a history of a long RP tachycardia, who  reports palpitations and discomfort in her chest with exercise.   BASELINE DATA:  Electrocardiogram reveals a sinus rhythm at 88 beats per  minute with nonspecific ST abnormalities.  Blood pressure was 120/78.   DESCRIPTION OF PROCEDURE:  The patient exercised for a total of 12 minutes  and 44 seconds into the Bruce protocol stage IV, and up to 12.8 METS.  Maximum heart rate was 160 beats per minute, which was 97% of predicted  maximum.  The maximum blood pressure was 160/90, which resolved down to  130/88 in recovery.  The patient denied any chest discomfort or palpitations  while exercising.  She did have some mild chest discomfort when she was  lying down after exercise, on her left side.  This was a very brief episode  and resolved quickly. On electrocardiogram she had a few PVC's; however, no  ischemic changes were noted.  The exercise was stopped secondary to fatigue  and the target heart rate achieved.  Echocardiogram images and final results are pending the M.D. review.      AB/MEDQ  D:  10/17/2004  T:  10/17/2004  Job:  [id number]

## 2011-04-05 NOTE — Discharge Summary (Signed)
[facility]  Patient:    Janice Durham, Janice Durham Visit Number: [id number] MRN: [id number]          Service Type: OUT Location: RAD Attending Physician:  Cain Sieve Dictated by:   Dorthey Sawyer, M.D. Admit Date:  08/20/2001 Discharge Date: 08/20/2001                             Discharge Summary  DISCHARGE DIAGNOSES:  Chest pain ruled out for myocardial infarction, most likely gastroesophageal reflux disease.  HISTORY OF PRESENT ILLNESS:  Please see admission H&P.  HOSPITAL COURSE:  A 60 year old female with probable GERD who was admitted with chest pain.  We wanted to be 100% this was not cardiac so we decided to place her on telemetry and rule out with CPK and troponins.  Chest x-ray negative on admission.  Laboratory work negative on admission.  Had a normal lipid panel in the office a few months prior to this admission.  She was also begun on the PPI.  She did well in the hospital with no further episodes of chest pain.  ECGs remained normal.  Telemetry normal as well.  Cardiology saw the patient and scheduled stress Cardiolite as well as echocardiogram.  On the day of discharge cardiology did a stress Cardiolite which was negative for ischemia. Echocardiogram pending at the time of discharge.  Again, the patient had resolution of all symptoms and was feeling quite well.  She was discharged home on Aciphex 20 mg q.d. as well as 325 aspirin q.d. and was to follow-up in the office one week after discharge.  PHYSICAL EXAMINATION:  Please see progress note on day of discharge.  CONDITION ON DISCHARGE:  Improved and stable. Dictated by:   Dorthey Sawyer, M.D. Attending Physician:  Cain Sieve DD:  08/24/01 TD:  08/24/01 Job: [id number]

## 2011-04-05 NOTE — Procedures (Signed)
NAME:  Janice Durham, Janice Durham               ACCOUNT NO.:  [id number]   MEDICAL RECORD NO.:  [id number]          PATIENT TYPE:  OUT   LOCATION:  RAD                           FACILITY:  [facility]   PHYSICIAN:  Vida Roller, M.D.   DATE OF BIRTH:  20-Nov-1950   DATE OF PROCEDURE:  10/17/2004  DATE OF DISCHARGE:                                  ECHOCARDIOGRAM   PRIMARY CARE PHYSICIAN:  Dr. Phillips Odor.   TAPE NUMBER:  [id number].   TAPE COUNT:  [id number].   HISTORY OF PRESENT ILLNESS:  This is a 60 year old woman with chest  discomfort.  A previous echocardiogram done in October 2002 was completely  normal.   DESCRIPTION OF PROCEDURE:  The technical quality of the study is adequate.  M-mode tracing the aorta is 27 mm.   The left atrium is 28 mm.   The septum is 10 mm.   The posterior wall is 8 mm.   Left ventricular diastolic dimension is 40 mm.   Left ventricular systolic dimension is 29 mm.   2-D AND DOPPLER IMAGING:  The left ventricle is normal size with normal  systolic function.  There are no wall motion abnormalities seen.  Estimated  ejection fraction is 55-60%.   The right ventricle is normal size with normal systolic function.   Both atria are normal size.   The aortic valve is slightly sclerotic with trivial insufficiency.  No  stenosis is seen.   The mitral valve is morphologically unremarkable with no evidence of  prolapse.  There is no regurgitation or stenosis seen.   The tricuspid valve has trace regurgitation.   The pulmonic valve was not well seen.   There is no pericardial effusion.   The inferior vena cava is normal size.   The ascending aorta is not well seen.     Trey Paula   JH/MEDQ  D:  10/17/2004  T:  10/17/2004  Job:  [id number]

## 2011-08-16 LAB — CBC
HCT: 40.3
MCHC: 34.3
MCV: 95
RBC: 4.24
RDW: 13.9
WBC: 7

## 2011-09-03 ENCOUNTER — Other Ambulatory Visit (INDEPENDENT_AMBULATORY_CARE_PROVIDER_SITE_OTHER): Payer: Self-pay | Admitting: *Deleted

## 2011-09-03 DIAGNOSIS — Z86010 Personal history of colonic polyps: Secondary | ICD-10-CM

## 2011-09-11 MED ORDER — SODIUM CHLORIDE 0.45 % IV SOLN
Freq: Once | INTRAVENOUS | Status: AC
  Administered 2011-09-12: 09:00:00 via INTRAVENOUS

## 2011-09-12 ENCOUNTER — Encounter (INDEPENDENT_AMBULATORY_CARE_PROVIDER_SITE_OTHER): Payer: Self-pay | Admitting: Internal Medicine

## 2011-09-12 ENCOUNTER — Encounter (HOSPITAL_COMMUNITY)
Admission: RE | Disposition: A | Discharge status: Home / Self Care | Payer: Self-pay | Source: Ambulatory Visit | Attending: Internal Medicine

## 2011-09-12 ENCOUNTER — Other Ambulatory Visit (INDEPENDENT_AMBULATORY_CARE_PROVIDER_SITE_OTHER): Payer: Self-pay | Admitting: Internal Medicine

## 2011-09-12 ENCOUNTER — Ambulatory Visit (HOSPITAL_COMMUNITY)
Admission: RE | Admit: 2011-09-12 | Discharge: 2011-09-12 | Disposition: A | Discharge status: Home / Self Care | Payer: BC Managed Care – PPO | Source: Ambulatory Visit | Attending: Internal Medicine | Admitting: Internal Medicine

## 2011-09-12 ENCOUNTER — Encounter (HOSPITAL_COMMUNITY): Payer: Self-pay | Admitting: *Deleted

## 2011-09-12 DIAGNOSIS — Z1211 Encounter for screening for malignant neoplasm of colon: Secondary | ICD-10-CM | POA: Insufficient documentation

## 2011-09-12 DIAGNOSIS — K644 Residual hemorrhoidal skin tags: Secondary | ICD-10-CM

## 2011-09-12 DIAGNOSIS — D126 Benign neoplasm of colon, unspecified: Secondary | ICD-10-CM | POA: Insufficient documentation

## 2011-09-12 DIAGNOSIS — Z86010 Personal history of colonic polyps: Secondary | ICD-10-CM

## 2011-09-12 HISTORY — DX: Other specified postprocedural states: Z98.890

## 2011-09-12 HISTORY — DX: Palpitations: R00.2

## 2011-09-12 HISTORY — PX: COLONOSCOPY: SHX5424

## 2011-09-12 HISTORY — DX: Nausea with vomiting, unspecified: R11.2

## 2011-09-12 HISTORY — DX: Unspecified hemorrhoids: K64.9

## 2011-09-12 HISTORY — DX: Hypothyroidism, unspecified: E03.9

## 2011-09-12 SURGERY — COLONOSCOPY
Anesthesia: Moderate Sedation

## 2011-09-12 MED ORDER — MEPERIDINE HCL 50 MG/ML IJ SOLN
INTRAMUSCULAR | Status: DC | PRN
  Administered 2011-09-12 (×2): 25 mg via INTRAVENOUS

## 2011-09-12 MED ORDER — LIDOCAINE HCL 2 % EX GEL
CUTANEOUS | Status: DC | PRN
  Administered 2011-09-12: 1 via TOPICAL

## 2011-09-12 MED ORDER — MIDAZOLAM HCL 5 MG/5ML IJ SOLN
INTRAMUSCULAR | Status: AC
  Filled 2011-09-12: qty 10

## 2011-09-12 MED ORDER — MIDAZOLAM HCL 5 MG/5ML IJ SOLN
INTRAMUSCULAR | Status: DC | PRN
  Administered 2011-09-12 (×4): 2 mg via INTRAVENOUS

## 2011-09-12 MED ORDER — STERILE WATER FOR IRRIGATION IR SOLN
Status: DC | PRN
  Administered 2011-09-12: 10:00:00

## 2011-09-12 MED ORDER — MEPERIDINE HCL 50 MG/ML IJ SOLN
INTRAMUSCULAR | Status: AC
  Filled 2011-09-12: qty 1

## 2011-09-12 NOTE — H&P (Signed)
Janice Durham is an 60 y.o. female.   Chief Complaint: Patient is here for colonoscopy. HPI: Patient is 60 year old Caucasian female who is here for screening colonoscopy. Her last exam was in her 9 years ago. Presumably she had a small hyperplastic polyps and she is free of GI symptoms. She denies abdominal pain change in her bowel habits or rectal bleeding. Family history is negative for colorectal carcinoma.  Past Medical History  Diagnosis Date  . Heart palpitations     occasional  . Hypothyroidism   . Hemorrhoid   . PONV (postoperative nausea and vomiting)     Past Surgical History  Procedure Date  . Tonsillectomy   . Right oophorectomy   . Right foot surgery   . 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 Colon cancer Neg Hx    Social History:  reports that she has never smoked. She does not have any smokeless tobacco history on file. She reports that she drinks about 1.8 ounces of alcohol per week. She reports that she does not use illicit drugs.  Allergies:  Allergies  Allergen Reactions  . Ciprofloxacin Rash  . Sulfa Antibiotics Rash    Medications Prior to Admission  Medication Dose Route Frequency Provider Last Rate Last Dose  . 0.45 % sodium chloride infusion   Intravenous Once Malissa Hippo, MD 20 mL/hr at 09/12/11 0915    . meperidine (DEMEROL) 50 MG/ML injection           . midazolam (VERSED) 5 MG/5ML injection            Medications Prior to Admission  Medication Sig Dispense Refill  . B Complex Vitamins (VITAMIN-B COMPLEX PO) Take 1 tablet by mouth daily.        . Coral Calcium 1000 (390 CA) MG TABS Take 1 tablet by mouth daily.        . Ergocalciferol (VITAMIN D2) 2000 UNITS TABS Take 1 tablet by mouth daily.        Marland Kitchen ibuprofen (ADVIL,MOTRIN) 200 MG tablet Take 200 mg by mouth every 6 (six) hours as needed. Pain       . Magnesium 100 MG CAPS Take 200 mg by mouth 2 (two) times daily.        . Omega-3  Fatty Acids (FISH OIL) 1000 MG CAPS Take 2 capsules by mouth daily.        Marland Kitchen PRESCRIPTION MEDICATION Apply 1 application topically daily. Biest. This is a hormonal cream that is compounded at a pharmacy.       . progesterone (PROMETRIUM) 100 MG capsule Take 100 mg by mouth at bedtime.        . Selenium (SELENIMIN-200) 200 MCG TABS Take by mouth.        . thyroid (ARMOUR) 15 MG tablet Take 15 mg by mouth daily.          No results found for this or any previous visit (from the past 48 hour(s)). No results found.  Review of Systems  Constitutional: Negative for weight loss.  Gastrointestinal: Negative for abdominal pain, diarrhea, constipation, blood in stool and melena.    Blood pressure 177/90, pulse 92, temperature 98 F (36.7 C), temperature source Oral, resp. rate 16, height 5\' 7"  (1.702 m), weight 134 lb (60.782 kg), SpO2 100.00%. Physical Exam  Constitutional: She appears well-developed and well-nourished.  HENT:  Mouth/Throat: Oropharynx is clear and moist.  Eyes: Conjunctivae are  normal. No scleral icterus.  Neck: No thyromegaly present.  Cardiovascular: Regular rhythm and normal heart sounds.   No murmur heard. Respiratory: Effort normal and breath sounds normal.  GI: Soft. She exhibits no distension and no mass.  Musculoskeletal: She exhibits no edema.  Lymphadenopathy:    She has no cervical adenopathy.  Neurological: She is alert.  Skin: Skin is warm.     Assessment/Plan Average risk screening colonoscopy.  Edwar Coe U 09/12/2011, 9:41 AM

## 2011-09-12 NOTE — Op Note (Signed)
COLONOSCOPY PROCEDURE REPORT  PATIENT:  Janice Durham  MR#:  [id number] Birthdate:  09-27-1951, 60 y.o., female Endoscopist:  Dr. Malissa Hippo, MD Referred By:  Dr. Colette Ribas, M.D. Procedure Date: 09/12/2011  Procedure:   Colonoscopy  Indications:  Patient is 60 year old female who is undergoing average risk screening colonoscopy.  Informed Consent: Procedure and risks were reviewed with the patient and informed consent was obtained. Medications:  Demerol 50 mg IV Versed 8 mg IV  Description of procedure:  After a digital rectal exam was performed, that colonoscope was advanced from the anus through the rectum and colon to the area of the cecum, ileocecal valve and appendiceal orifice. The cecum was deeply intubated. These structures were well-seen and photographed for the record. From the level of the cecum and ileocecal valve, the scope was slowly and cautiously withdrawn. The mucosal surfaces were carefully surveyed utilizing scope tip to flexion to facilitate fold flattening as needed. The scope was pulled down into the rectum where a thorough exam including retroflexion was performed. Terminal ileum was also examined.  Findings:   Normal terminal ileum. 3 mm polyp ablated via cold biopsy from descending colon. Rest of the mucosa was normal. Hemorrhoids noted below the dentate line.  Therapeutic/Diagnostic Maneuvers Performed:  See above  Complications:  None  Cecal Withdrawal Time:  10 minutes  Impression:  Examination performed to cecum. 3 mm polyp ablated via cold biopsy from descending colon. External hemorrhoids.  Recommendations:  Standard instructions given. I will be contacting patient with biopsy results. Given today's findings her next exam would be in 10 years  REHMAN,NAJEEB U  09/12/2011 10:12 AM  CC: Dr. Colette Ribas, MD & Dr. No ref. provider found

## 2011-09-18 ENCOUNTER — Encounter (HOSPITAL_COMMUNITY): Payer: Self-pay | Admitting: Internal Medicine

## 2011-09-20 ENCOUNTER — Encounter (INDEPENDENT_AMBULATORY_CARE_PROVIDER_SITE_OTHER): Payer: Self-pay | Admitting: *Deleted

## 2011-10-21 ENCOUNTER — Other Ambulatory Visit (HOSPITAL_COMMUNITY): Payer: Self-pay | Admitting: Physician Assistant

## 2011-10-21 ENCOUNTER — Ambulatory Visit (HOSPITAL_COMMUNITY)
Admission: RE | Admit: 2011-10-21 | Discharge: 2011-10-21 | Disposition: A | Discharge status: Home / Self Care | Payer: BC Managed Care – PPO | Source: Ambulatory Visit | Attending: Physician Assistant | Admitting: Physician Assistant

## 2011-10-21 DIAGNOSIS — R079 Chest pain, unspecified: Secondary | ICD-10-CM

## 2011-10-31 ENCOUNTER — Other Ambulatory Visit: Payer: Self-pay | Admitting: Dermatology

## 2012-08-21 ENCOUNTER — Other Ambulatory Visit: Payer: Self-pay

## 2013-11-24 ENCOUNTER — Other Ambulatory Visit: Payer: Self-pay

## 2014-05-10 ENCOUNTER — Other Ambulatory Visit (HOSPITAL_COMMUNITY): Payer: Self-pay | Admitting: Family Medicine

## 2014-05-10 ENCOUNTER — Ambulatory Visit (HOSPITAL_COMMUNITY)
Admission: RE | Admit: 2014-05-10 | Discharge: 2014-05-10 | Disposition: A | Discharge status: Home / Self Care | Payer: Medicare Other | Source: Ambulatory Visit | Attending: Family Medicine | Admitting: Family Medicine

## 2014-05-10 DIAGNOSIS — R062 Wheezing: Secondary | ICD-10-CM | POA: Insufficient documentation

## 2014-05-10 DIAGNOSIS — R05 Cough: Secondary | ICD-10-CM

## 2014-05-10 DIAGNOSIS — Z0000 Encounter for general adult medical examination without abnormal findings: Secondary | ICD-10-CM

## 2014-05-10 DIAGNOSIS — R059 Cough, unspecified: Secondary | ICD-10-CM

## 2015-10-27 ENCOUNTER — Other Ambulatory Visit: Payer: Self-pay

## 2015-11-28 ENCOUNTER — Other Ambulatory Visit (HOSPITAL_COMMUNITY): Payer: Self-pay | Admitting: Family Medicine

## 2015-11-28 ENCOUNTER — Ambulatory Visit (HOSPITAL_COMMUNITY)
Admission: RE | Admit: 2015-11-28 | Discharge: 2015-11-28 | Disposition: A | Discharge status: Home / Self Care | Payer: BLUE CROSS/BLUE SHIELD | Source: Ambulatory Visit | Attending: Family Medicine | Admitting: Family Medicine

## 2015-11-28 DIAGNOSIS — Z1389 Encounter for screening for other disorder: Secondary | ICD-10-CM

## 2015-11-28 DIAGNOSIS — R918 Other nonspecific abnormal finding of lung field: Secondary | ICD-10-CM | POA: Diagnosis not present

## 2015-11-28 DIAGNOSIS — R05 Cough: Secondary | ICD-10-CM | POA: Insufficient documentation

## 2016-03-20 ENCOUNTER — Ambulatory Visit (HOSPITAL_COMMUNITY)
Admission: RE | Admit: 2016-03-20 | Discharge: 2016-03-20 | Disposition: A | Discharge status: Home / Self Care | Payer: BLUE CROSS/BLUE SHIELD | Source: Ambulatory Visit | Attending: Registered Nurse | Admitting: Registered Nurse

## 2016-03-20 ENCOUNTER — Other Ambulatory Visit (HOSPITAL_COMMUNITY): Payer: Self-pay | Admitting: Registered Nurse

## 2016-03-20 DIAGNOSIS — W109XXA Fall (on) (from) unspecified stairs and steps, initial encounter: Secondary | ICD-10-CM | POA: Diagnosis not present

## 2016-03-20 DIAGNOSIS — Z6822 Body mass index (BMI) 22.0-22.9, adult: Secondary | ICD-10-CM | POA: Diagnosis not present

## 2016-03-20 DIAGNOSIS — Z1389 Encounter for screening for other disorder: Secondary | ICD-10-CM | POA: Insufficient documentation

## 2016-05-28 IMAGING — DX DG CHEST 2V
2 series · 2 of 2 positions shown · non-contrast
Comparison: 05/10/2014 .  10/21/2011.  01/28/2006.

CLINICAL DATA: Dry cough.

EXAM:
CHEST  2 VIEW

[chest lat]
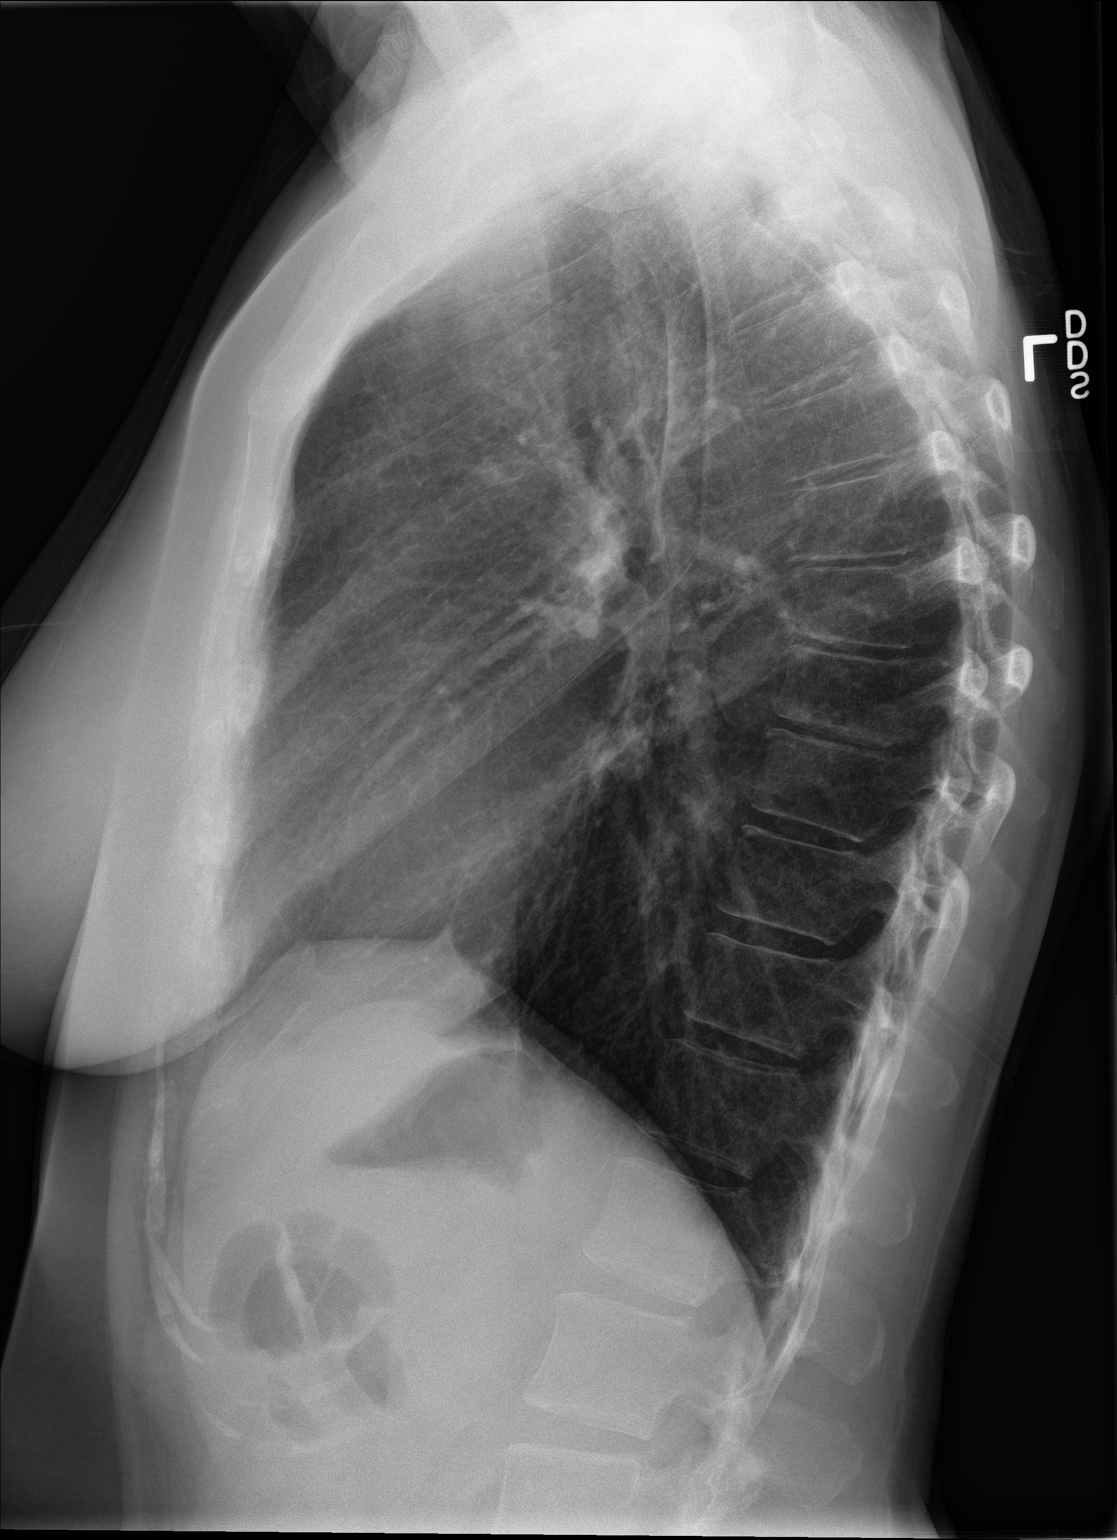

[chest pa]
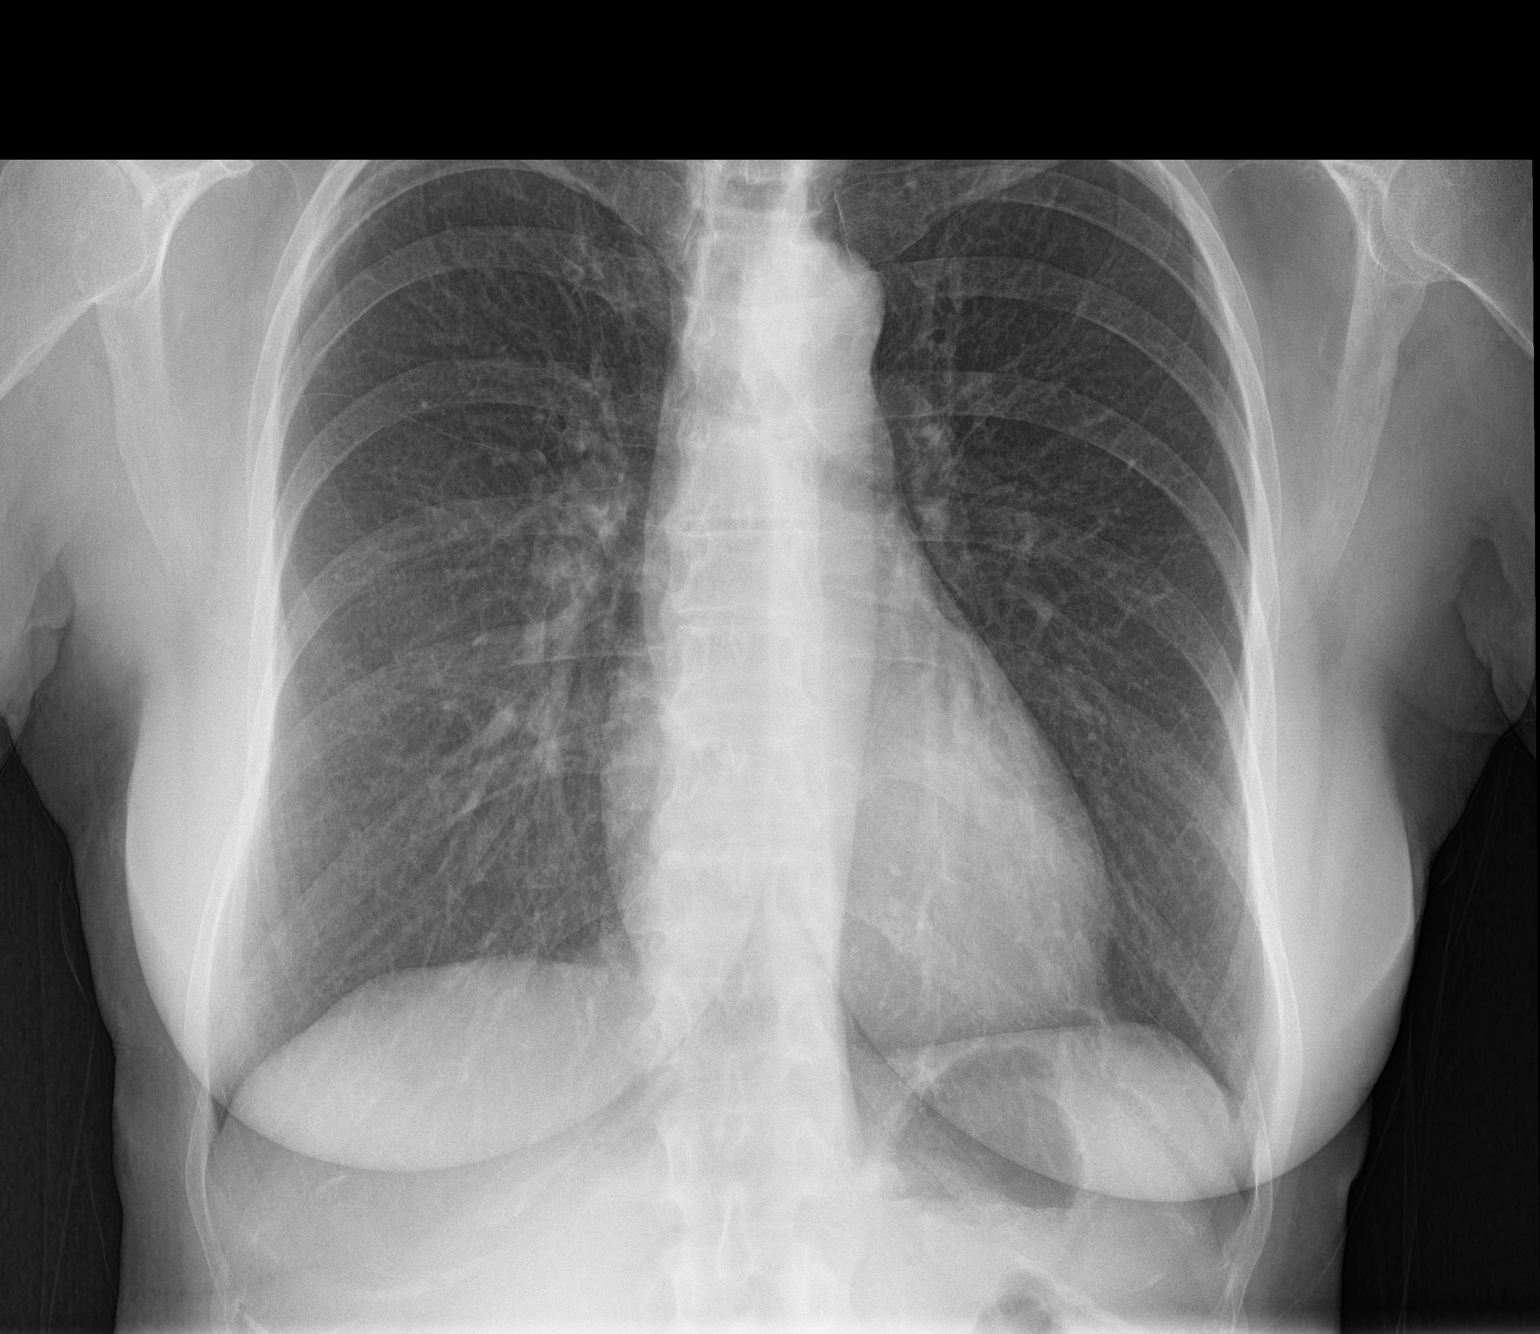

[2 of 2 positions shown; findings below may reference images not displayed]

FINDINGS: Mediastinum and hilar structures normal. Minimal bilateral pulmonary
interstitial prominence, mild interstitial process including
pneumonitis cannot be excluded . Lungs are clear of alveolar
infiltrates. No pleural effusion or pneumothorax. Heart size normal.
No acute bony abnormality .
IMPRESSION: Minimal bilateral pulmonary interstitial prominence. Mild
interstitial process including pneumonitis cannot be excluded. No
focal alveolar infiltrates. Heart size normal.

## 2016-06-18 NOTE — Patient Instructions (Addendum)
Your procedure is scheduled on:  Friday, June 28, 2016  Enter through the Main Entrance of [facility] at:  7:30 AM  Pick up the phone at the desk and dial [phone].  Call this number if you have problems the morning of surgery: [phone].  Remember: Do NOT eat food or drink after:  Midnight Thursday, June 27, 2016  Take these medicines the morning of surgery with a SIP OF WATER:  Losartan, Thyroid  Stop taking Fish Oil at this time.  Do NOT wear jewelry (body piercing), metal hair clips/bobby pins, make-up, or nail polish. Do NOT wear lotions, powders, or perfumes.  You may wear deodorant. Do NOT shave for 48 hours prior to surgery. Do NOT bring valuables to the hospital. Contacts, dentures, or bridgework may not be worn into surgery.  Have a responsible adult drive you home and stay with you for 24 hours after your procedure

## 2016-06-19 ENCOUNTER — Other Ambulatory Visit: Payer: Self-pay

## 2016-06-19 ENCOUNTER — Encounter (HOSPITAL_COMMUNITY)
Admission: RE | Admit: 2016-06-19 | Discharge: 2016-06-19 | Disposition: A | Discharge status: Home / Self Care | Payer: BLUE CROSS/BLUE SHIELD | Source: Ambulatory Visit | Attending: Obstetrics & Gynecology | Admitting: Obstetrics & Gynecology

## 2016-06-19 ENCOUNTER — Encounter (HOSPITAL_COMMUNITY): Payer: Self-pay

## 2016-06-19 DIAGNOSIS — Z01812 Encounter for preprocedural laboratory examination: Secondary | ICD-10-CM | POA: Insufficient documentation

## 2016-06-19 DIAGNOSIS — Z01810 Encounter for preprocedural cardiovascular examination: Secondary | ICD-10-CM | POA: Diagnosis not present

## 2016-06-19 HISTORY — DX: Anxiety disorder, unspecified: F41.9

## 2016-06-19 HISTORY — DX: Essential (primary) hypertension: I10

## 2016-06-19 HISTORY — DX: Pneumonia, unspecified organism: J18.9

## 2016-06-19 LAB — CBC
HEMATOCRIT: 38.4 % (ref 36.0–46.0)
Hemoglobin: 13.8 g/dL (ref 12.0–15.0)
MCH: 32.6 pg (ref 26.0–34.0)
MCHC: 35.9 g/dL (ref 30.0–36.0)
MCV: 90.8 fL (ref 78.0–100.0)
Platelets: 300 10*3/uL (ref 150–400)
RBC: 4.23 MIL/uL (ref 3.87–5.11)
RDW: 12.9 % (ref 11.5–15.5)
WBC: 7.4 10*3/uL (ref 4.0–10.5)

## 2016-06-19 LAB — BASIC METABOLIC PANEL
ANION GAP: 8 (ref 5–15)
BUN: 7 mg/dL (ref 6–20)
CO2: 28 mmol/L (ref 22–32)
Calcium: 9.3 mg/dL (ref 8.9–10.3)
Chloride: 93 mmol/L — ABNORMAL LOW (ref 101–111)
Creatinine, Ser: 0.56 mg/dL (ref 0.44–1.00)
GFR calc Af Amer: >60 mL/min (ref >60)
GFR calc non Af Amer: >60 mL/min (ref >60)
GLUCOSE: 93 mg/dL (ref 65–99)
POTASSIUM: 3.9 mmol/L (ref 3.5–5.1)
Sodium: 129 mmol/L — ABNORMAL LOW (ref 135–145)

## 2016-06-19 NOTE — Pre-Procedure Instructions (Signed)
Dr. Imogene Burn made aware of sodium level no new orders received at this time.

## 2016-06-24 NOTE — H&P (Signed)
Janice Durham is an 65 y.o. female. She presents with two episodes of vaginal bleeding over the last month.  She had an office endometrial polypectomy around 5 years ago.   Pertinent Gynecological History: OB History: G2, P2   Menstrual History: No LMP recorded. Patient is postmenopausal.    Past Medical History:  Diagnosis Date  . Anxiety   . Heart palpitations    occasional  . Hemorrhoid   . Hypertension   . Hypothyroidism   . Pneumonia    11/2015  . PONV (postoperative nausea and vomiting)     Past Surgical History:  Procedure Laterality Date  . COLONOSCOPY  09/12/2011   Procedure: COLONOSCOPY;  Surgeon: Rogene Houston, MD;  Location: [facility];  Service: Endoscopy;  Laterality: N/A;  . Right foot surgery    . RIGHT OOPHORECTOMY    . TONSILLECTOMY    . 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 Colon cancer Neg Hx     Social History:  reports that she has never smoked. She has never used smokeless tobacco. She reports that she drinks about 1.8 oz of alcohol per week . She reports that she does not use drugs.  Allergies:  Allergies  Allergen Reactions  . Ciprofloxacin Rash  . Sulfa Antibiotics Rash    No prescriptions prior to admission.    Review of Systems  Constitutional: Negative.   HENT: Negative.   Eyes: Negative.   Respiratory: Negative.   Cardiovascular: Negative.   Skin: Negative.   Neurological: Negative.   Endo/Heme/Allergies: Negative.   Psychiatric/Behavioral: Negative.     There were no vitals taken for this visit. Physical Exam  Constitutional: She appears well-developed and well-nourished.  HENT:  Head: Normocephalic.  Eyes: Pupils are equal, round, and reactive to light.  Neck: Normal range of motion. No thyromegaly present.  Cardiovascular: Normal rate.   Respiratory: Effort normal.  GI: Soft.  Genitourinary: Vagina normal and uterus normal.  Neurological: She is alert.    Office ultrasound shows 9 mm endometrial thickness and a polyoid mass in the endometrial cavity.  Assessment/Plan: Post menopausal bleeding and possible recurrent endometrial polyp.  She is admitted for hysteroscopy and endometrial sampling and  Possible polypectomy.  Markian Glockner D 06/24/2016, 9:31 AM

## 2016-06-28 ENCOUNTER — Encounter (HOSPITAL_COMMUNITY)
Admission: RE | Disposition: A | Discharge status: Home / Self Care | Payer: Self-pay | Source: Ambulatory Visit | Attending: Obstetrics & Gynecology

## 2016-06-28 ENCOUNTER — Encounter (HOSPITAL_COMMUNITY): Payer: Self-pay

## 2016-06-28 ENCOUNTER — Ambulatory Visit (HOSPITAL_COMMUNITY): Payer: BLUE CROSS/BLUE SHIELD | Admitting: Anesthesiology

## 2016-06-28 ENCOUNTER — Ambulatory Visit (HOSPITAL_COMMUNITY)
Admission: RE | Admit: 2016-06-28 | Discharge: 2016-06-28 | Disposition: A | Discharge status: Home / Self Care | Payer: BLUE CROSS/BLUE SHIELD | Source: Ambulatory Visit | Attending: Obstetrics & Gynecology | Admitting: Obstetrics & Gynecology

## 2016-06-28 DIAGNOSIS — I10 Essential (primary) hypertension: Secondary | ICD-10-CM | POA: Diagnosis not present

## 2016-06-28 DIAGNOSIS — N950 Postmenopausal bleeding: Secondary | ICD-10-CM | POA: Insufficient documentation

## 2016-06-28 DIAGNOSIS — R938 Abnormal findings on diagnostic imaging of other specified body structures: Secondary | ICD-10-CM | POA: Diagnosis not present

## 2016-06-28 DIAGNOSIS — E039 Hypothyroidism, unspecified: Secondary | ICD-10-CM | POA: Insufficient documentation

## 2016-06-28 DIAGNOSIS — F419 Anxiety disorder, unspecified: Secondary | ICD-10-CM | POA: Insufficient documentation

## 2016-06-28 DIAGNOSIS — N840 Polyp of corpus uteri: Secondary | ICD-10-CM | POA: Insufficient documentation

## 2016-06-28 DIAGNOSIS — N939 Abnormal uterine and vaginal bleeding, unspecified: Secondary | ICD-10-CM | POA: Diagnosis present

## 2016-06-28 HISTORY — PX: CERVICAL POLYPECTOMY: SHX88

## 2016-06-28 HISTORY — PX: DILATATION & CURETTAGE/HYSTEROSCOPY WITH MYOSURE: SHX6511

## 2016-06-28 SURGERY — POLYPECTOMY, CERVIX
Anesthesia: Monitor Anesthesia Care | Site: Vagina

## 2016-06-28 MED ORDER — MIDAZOLAM HCL 2 MG/2ML IJ SOLN
INTRAMUSCULAR | Status: AC
  Filled 2016-06-28: qty 2

## 2016-06-28 MED ORDER — ARTIFICIAL TEARS OP OINT
TOPICAL_OINTMENT | OPHTHALMIC | Status: AC
  Filled 2016-06-28: qty 3.5

## 2016-06-28 MED ORDER — DEXAMETHASONE SODIUM PHOSPHATE 10 MG/ML IJ SOLN
INTRAMUSCULAR | Status: DC | PRN
  Administered 2016-06-28: 4 mg via INTRAVENOUS

## 2016-06-28 MED ORDER — LACTATED RINGERS IV SOLN
INTRAVENOUS | Status: DC
  Administered 2016-06-28: 125 mL/h via INTRAVENOUS

## 2016-06-28 MED ORDER — LIDOCAINE HCL (CARDIAC) 20 MG/ML IV SOLN
INTRAVENOUS | Status: AC
  Filled 2016-06-28: qty 5

## 2016-06-28 MED ORDER — ONDANSETRON HCL 4 MG/2ML IJ SOLN
INTRAMUSCULAR | Status: DC | PRN
  Administered 2016-06-28: 4 mg via INTRAVENOUS

## 2016-06-28 MED ORDER — FENTANYL CITRATE (PF) 100 MCG/2ML IJ SOLN
INTRAMUSCULAR | Status: AC
  Filled 2016-06-28: qty 2

## 2016-06-28 MED ORDER — LIDOCAINE HCL 2 % IJ SOLN
INTRAMUSCULAR | Status: AC
  Filled 2016-06-28: qty 20

## 2016-06-28 MED ORDER — KETOROLAC TROMETHAMINE 30 MG/ML IJ SOLN
INTRAMUSCULAR | Status: AC
  Filled 2016-06-28: qty 1

## 2016-06-28 MED ORDER — PROPOFOL 10 MG/ML IV BOLUS
INTRAVENOUS | Status: AC
  Filled 2016-06-28: qty 20

## 2016-06-28 MED ORDER — MIDAZOLAM HCL 2 MG/2ML IJ SOLN
INTRAMUSCULAR | Status: DC | PRN
  Administered 2016-06-28: 2 mg via INTRAVENOUS

## 2016-06-28 MED ORDER — FENTANYL CITRATE (PF) 100 MCG/2ML IJ SOLN
25.0000 ug | INTRAMUSCULAR | Status: DC | PRN
  Administered 2016-06-28 (×2): 50 ug via INTRAVENOUS

## 2016-06-28 MED ORDER — DEXAMETHASONE SODIUM PHOSPHATE 4 MG/ML IJ SOLN
INTRAMUSCULAR | Status: AC
  Filled 2016-06-28: qty 1

## 2016-06-28 MED ORDER — LIDOCAINE HCL (CARDIAC) 20 MG/ML IV SOLN
INTRAVENOUS | Status: DC | PRN
  Administered 2016-06-28: 30 mg via INTRAVENOUS
  Administered 2016-06-28: 70 mg via INTRAVENOUS

## 2016-06-28 MED ORDER — PROPOFOL 10 MG/ML IV BOLUS
INTRAVENOUS | Status: DC | PRN
  Administered 2016-06-28: 10 mg via INTRAVENOUS
  Administered 2016-06-28 (×2): 20 mg via INTRAVENOUS
  Administered 2016-06-28: 10 mg via INTRAVENOUS
  Administered 2016-06-28 (×3): 20 mg via INTRAVENOUS
  Administered 2016-06-28: 10 mg via INTRAVENOUS
  Administered 2016-06-28: 20 mg via INTRAVENOUS
  Administered 2016-06-28: 30 mg via INTRAVENOUS

## 2016-06-28 MED ORDER — KETOROLAC TROMETHAMINE 30 MG/ML IJ SOLN
INTRAMUSCULAR | Status: DC | PRN
  Administered 2016-06-28: 30 mg via INTRAVENOUS

## 2016-06-28 MED ORDER — SODIUM CHLORIDE 0.9 % IR SOLN
Status: DC | PRN
  Administered 2016-06-28: 3000 mL

## 2016-06-28 MED ORDER — LACTATED RINGERS IV SOLN: INTRAVENOUS | Status: DC

## 2016-06-28 MED ORDER — LIDOCAINE HCL 2 % IJ SOLN
INTRAMUSCULAR | Status: DC | PRN
  Administered 2016-06-28: 10 mL

## 2016-06-28 MED ORDER — ONDANSETRON HCL 4 MG/2ML IJ SOLN
INTRAMUSCULAR | Status: AC
  Filled 2016-06-28: qty 2

## 2016-06-28 MED ORDER — FENTANYL CITRATE (PF) 100 MCG/2ML IJ SOLN
INTRAMUSCULAR | Status: DC | PRN
  Administered 2016-06-28 (×2): 50 ug via INTRAVENOUS

## 2016-06-28 SURGICAL SUPPLY — 21 items
BIPOLAR CUTTING LOOP 21FR (ELECTRODE) ×2
CANISTER SUCT 3000ML (MISCELLANEOUS) ×5 IMPLANT
CATH ROBINSON RED A/P 16FR (CATHETERS) ×5 IMPLANT
CLOTH BEACON ORANGE TIMEOUT ST (SAFETY) ×5 IMPLANT
CONTAINER PREFILL 10% NBF 60ML (FORM) ×10 IMPLANT
DEVICE MYOSURE LITE (MISCELLANEOUS) ×5 IMPLANT
ELECT REM PT RETURN 9FT ADLT (ELECTROSURGICAL)
ELECTRODE REM PT RTRN 9FT ADLT (ELECTROSURGICAL) IMPLANT
GLOVE BIOGEL PI IND STRL 7.0 (GLOVE) ×3 IMPLANT
GLOVE BIOGEL PI INDICATOR 7.0 (GLOVE) ×2
GLOVE ECLIPSE 6.0 STRL STRAW (GLOVE) ×10 IMPLANT
GOWN STRL REUS W/TWL LRG LVL3 (GOWN DISPOSABLE) ×10 IMPLANT
LOOP CUTTING BIPOLAR 21FR (ELECTRODE) ×3 IMPLANT
PACK VAGINAL MINOR WOMEN LF (CUSTOM PROCEDURE TRAY) ×5 IMPLANT
PAD OB MATERNITY 4.3X12.25 (PERSONAL CARE ITEMS) ×5 IMPLANT
PAD PREP 24X48 CUFFED NSTRL (MISCELLANEOUS) ×5 IMPLANT
SEAL ROD LENS SCOPE MYOSURE (ABLATOR) ×5 IMPLANT
TOWEL OR 17X24 6PK STRL BLUE (TOWEL DISPOSABLE) ×10 IMPLANT
TUBING AQUILEX INFLOW (TUBING) ×5 IMPLANT
TUBING AQUILEX OUTFLOW (TUBING) ×5 IMPLANT
WATER STERILE IRR 1000ML POUR (IV SOLUTION) ×5 IMPLANT

## 2016-06-28 NOTE — Anesthesia Postprocedure Evaluation (Signed)
Anesthesia Post Note  Patient: Janice Durham  Procedure(s) Performed: Procedure(s) (LRB): CERVICAL POLYPECTOMY (N/A) [place]  Patient location during evaluation: PACU Anesthesia Type: MAC Level of consciousness: awake and alert Pain management: pain level controlled Vital Signs Assessment: post-procedure vital signs reviewed and stable Respiratory status: spontaneous breathing, nonlabored ventilation, respiratory function stable and patient connected to nasal cannula oxygen Cardiovascular status: stable and blood pressure returned to baseline Anesthetic complications: no     Last Vitals:  Vitals:   06/28/16 1015 06/28/16 1030  BP: (!) 146/71 136/74  Pulse: 67 69  Resp: 14 14  Temp:  37.1 C    Last Pain:  Vitals:   06/28/16 1030  TempSrc:   PainSc: 1    Pain Goal: Patients Stated Pain Goal: 3 (06/28/16 0945)               Devyn Sheerin L

## 2016-06-28 NOTE — Transfer of Care (Signed)
Immediate Anesthesia Transfer of Care Note  Patient: Janice Durham  Procedure(s) Performed: Procedure(s): DILATATION AND CURETTAGE /HYSTEROSCOPY (N/A) CERVICAL POLYPECTOMY (N/A)  Patient Location: PACU  Anesthesia Type:MAC  Level of Consciousness: awake, alert , oriented and patient cooperative  Airway & Oxygen Therapy: Patient Spontanous Breathing and Patient connected to nasal cannula oxygen  Post-op Assessment: Report given to RN and Post -op Vital signs reviewed and stable  Post vital signs: Reviewed and stable  Last Vitals:  Vitals:   06/28/16 0738  BP: (!) 193/91  Pulse: 79  Resp: 20  Temp: 36.5 C    Last Pain:  Vitals:   06/28/16 0738  TempSrc: Oral      Patients Stated Pain Goal: 3 ([id number])  Complications: No apparent anesthesia complications

## 2016-06-28 NOTE — Progress Notes (Signed)
I have interviewed and performed the pertinent exams on my patient to confirm that there have been no significant changes in her condition since the dictation of her history and physical exam.  

## 2016-06-28 NOTE — Op Note (Signed)
Patient Name: Janice Durham MRN: [id number]  Date of Surgery: 06/28/2016    PREOPERATIVE DIAGNOSIS: POST MENOPAUSAL BLEEDING THICKENED ENDOMETRIUM  POSTOPERATIVE DIAGNOSIS: post menopausal bleeding, thickened endometrium   PROCEDURE: Myosure hysteroscopy and polypectomy  SURGEON: Janice Coffin. Deatra Ina M.D.  ANESTHESIA: MAC  ESTIMATED BLOOD LOSS: * No blood loss amount entered *  FINDINGS: 2 endometrial polyps about 6 mm.   INDICATIONS: PMB and thickened endometrium.  Rule our carcinoma.  PROCEDURE IN DETAIL: The patient was taken to the OR and placed in the dors-lithotomy position. The perineum and vagina were prepped and draped in a sterile fashion. Bimanual exam revealed an anteverted,normal sized uterus. 10 ml of 2% lidocaine was infiltrated in the paracervical tissue and Pratt dilators were used to open the cervix to 21 [place]. A diagnostic hysteroscope was used to evaluate the endometrial cavity and the endometrium was atrophic.  5 mm side by side endometrial polyps were identified.  The cervix was redilated to 35 [place] and the myosure hysteroscope was placed and the polyps were removed under direct vision. The procedure was then terminated and the patient left the operating room in good condition.

## 2016-06-28 NOTE — Anesthesia Preprocedure Evaluation (Addendum)
Anesthesia Evaluation  Patient identified by MRN, date of birth, ID band Patient awake    Reviewed: Allergy & Precautions, H&P , NPO status , Patient's Chart, lab work & pertinent test results, reviewed documented beta blocker date and time   History of Anesthesia Complications (+) PONV  Airway Mallampati: II  TM Distance: >3 FB Neck ROM: full    Dental no notable dental hx. (+) Dental Advisory Given, Teeth Intact   Pulmonary neg pulmonary ROS,    Pulmonary exam normal breath sounds clear to auscultation       Cardiovascular Exercise Tolerance: Good hypertension, Normal cardiovascular exam+ dysrhythmias Supra Ventricular Tachycardia  Rhythm:regular Rate:Normal  palpitations   Neuro/Psych Anxiety negative neurological ROS  negative psych ROS   GI/Hepatic negative GI ROS, Neg liver ROS,   Endo/Other  Hypothyroidism   Renal/GU negative Renal ROS  negative genitourinary   Musculoskeletal   Abdominal   Peds  Hematology negative hematology ROS (+)   Anesthesia Other Findings   Reproductive/Obstetrics negative OB ROS                             Anesthesia Physical Anesthesia Plan  ASA: II  Anesthesia Plan: MAC   Post-op Pain Management:    Induction:   Airway Management Planned:   Additional Equipment:   Intra-op Plan:   Post-operative Plan:   Informed Consent: I have reviewed the patients History and Physical, chart, labs and discussed the procedure including the risks, benefits and alternatives for the proposed anesthesia with the patient or authorized representative who has indicated his/her understanding and acceptance.   Dental Advisory Given  Plan Discussed with: CRNA  Anesthesia Plan Comments:        Anesthesia Quick Evaluation

## 2016-06-28 NOTE — Discharge Instructions (Signed)
Hysteroscopy, Care After Refer to this sheet in the next few weeks. These instructions provide you with information on caring for yourself after your procedure. Your health care provider may also give you more specific instructions. Your treatment has been planned according to current medical practices, but problems sometimes occur. Call Dr. Deatra Ina at [phone] if you have any problems or questions after your procedure.   WHAT TO EXPECT AFTER THE PROCEDURE After your procedure, it is typical to have the following:  You may have some cramping. This normally lasts for a couple days.  You may have bleeding. This can vary from light spotting for a few days to menstrual-like bleeding for 3-7 days. HOME CARE INSTRUCTIONS  Rest for the first 1-2 days after the procedure.  Only take over-the-counter or prescription medicines as directed by your health care provider. Do not take aspirin. It can increase the chances of bleeding.  Take showers instead of baths for 2 weeks or as directed by your health care provider.  Do not drive for 24 hours or as directed.  Do not drink alcohol while taking pain medicine.  Do not use tampons, douche, or have sexual intercourse for 1 week or until your health care provider says it is okay.  If you develop constipation, you may:  Take a mild laxative if your health care provider approves.  Add bran foods to your diet.  Drink enough fluids to keep your urine clear or pale yellow.  Try to have someone with you or available to you for the first 24-48 hours, especially if you were given a general anesthetic.  Follow up with your health care provider as directed. Call Dr. Deatra Ina if you have any concerns: [phone]   You feel dizzy or lightheaded.  -You feel sick to your stomach (nauseous).  You have abnormal vaginal discharge.  You have a rash.  You have pain that is not controlled with medicine. SEEK IMMEDIATE MEDICAL CARE IF:  You have bleeding  that is heavier than a normal menstrual period.  -You have a fever.  You have increasing cramps or pain, not controlled with medicine.  You have new belly (abdominal) pain.  You pass out.  You have pain in the tops of your shoulders (shoulder strap areas).  You have shortness of breath.   This information is not intended to replace advice given to you by your health care provider. Make sure you discuss any questions you have with your health care provider.   Document Released: 08/25/2013 Document Reviewed: 08/25/2013 Elsevier Interactive Patient Education [employer].

## 2016-07-01 ENCOUNTER — Encounter (HOSPITAL_COMMUNITY): Payer: Self-pay | Admitting: Obstetrics & Gynecology

## 2016-08-01 ENCOUNTER — Other Ambulatory Visit: Payer: Self-pay

## 2017-03-24 ENCOUNTER — Other Ambulatory Visit: Payer: Self-pay

## 2017-09-12 ENCOUNTER — Encounter (INDEPENDENT_AMBULATORY_CARE_PROVIDER_SITE_OTHER): Payer: Self-pay | Admitting: Internal Medicine

## 2017-10-01 ENCOUNTER — Encounter (INDEPENDENT_AMBULATORY_CARE_PROVIDER_SITE_OTHER): Payer: Self-pay | Admitting: Internal Medicine

## 2017-10-01 ENCOUNTER — Ambulatory Visit (INDEPENDENT_AMBULATORY_CARE_PROVIDER_SITE_OTHER): Payer: Medicare Other | Admitting: Internal Medicine

## 2017-10-01 VITALS — BP 160/90 | HR 72 | Temp 97.0°F | Ht 66.5 in | Wt 140.1 lb

## 2017-10-01 DIAGNOSIS — K219 Gastro-esophageal reflux disease without esophagitis: Secondary | ICD-10-CM | POA: Diagnosis not present

## 2017-10-01 MED ORDER — PANTOPRAZOLE SODIUM 40 MG PO TBEC: 40.0000 mg | DELAYED_RELEASE_TABLET | Freq: Every day | ORAL | 3 refills | Status: AC

## 2017-10-01 NOTE — Progress Notes (Signed)
   Subjective:    Patient ID: Janice Durham, female    DOB: 1951/08/12, 66 y.o.   MRN: [id number]  HPI Referred by Dr Hilma Favors for abdominal pain.  She thinks she has a hernia. When she was putting her leggings on this am, she has an abdominal cramp.  Symptoms started about 2 yrs ago. Pain is not worse.  Pain last a few seconds. US abdomen was normal. She has 2-3 BMs a day. She has some bloating. No nausea or vomiting. She occasionally take Gas X or Zantac.  Appetite is good. No weight loss. No family hx of colon cancer.     10/ 19/2018 ALP 54, AST 23, ALT 21 H and H 13.3 and 39.5 09/26/2017 US abdomen; abdominal pain. Fatty liver. No acute findings.   09/12/2011 Colonoscopy: Screening.  Polyp hyperplastic. Next colonosocpy 10 yrs.   Review of Systems  Past Medical History:  Diagnosis Date  . Anxiety   . Heart palpitations    occasional  . Hemorrhoid   . Hypertension   . Hypothyroidism   . Pneumonia    11/2015  . PONV (postoperative nausea and vomiting)     Past Surgical History:  Procedure Laterality Date  . Right foot surgery    . RIGHT OOPHORECTOMY    . TONSILLECTOMY    . 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 Ciprofloxacin Rash  . Sulfa Antibiotics Rash    Current Outpatient Medications on File Prior to Visit  Medication Sig Dispense Refill  . Ascorbic Acid (VITAMIN C) 1000 MG tablet Take 1,000 mg by mouth daily.    Marland Kitchen aspirin 81 MG EC tablet Take 81 mg by mouth daily.    . B Complex Vitamins (VITAMIN-B COMPLEX PO) Take 1 tablet by mouth daily.      . Biotin 1000 MCG tablet Take 1,000 mcg by mouth daily.    . Cholecalciferol (VITAMIN D) 2000 units tablet Take 2,000 Units by mouth daily.    . diazepam (VALIUM) 5 MG tablet Take 5 mg by mouth every 6 (six) hours as needed for anxiety.     Marland Kitchen losartan (COZAAR) 100 MG tablet Take 100 mg by mouth daily.    . Magnesium 100 MG TABS Take 200 mg by mouth 2 (two) times daily.    .  Milk Thistle 200 MG CAPS Take 2 capsules by mouth daily.    Marland Kitchen thyroid (ARMOUR) 15 MG tablet Take 30 mg by mouth daily.     . TURMERIC PO Take 1 capsule by mouth daily.     No current facility-administered medications on file prior to visit.         Objective:   Physical Exam Blood pressure (!) 160/90, pulse 72, temperature (!) 97 F (36.1 C), height 5' 6.5" (1.689 m), weight 140 lb 1.6 oz (63.5 kg). Alert and oriented. Skin warm and dry. Oral mucosa is moist.   . Sclera anicteric, conjunctivae is pink. Thyroid not enlarged. No cervical lymphadenopathy. Lungs clear. Heart regular rate and rhythm.  Abdomen is soft. Bowel sounds are positive. No hepatomegaly. No abdominal masses felt. No tenderness.  No edema to lower extremities.         Assessment & Plan:  ? Jerrye Bushy. Will start on Protonix 40mg .  OV in 6 months.

## 2017-10-01 NOTE — Patient Instructions (Signed)
OV in 3 months. Pick up your Rx at your pharmacy

## 2018-01-01 ENCOUNTER — Encounter (INDEPENDENT_AMBULATORY_CARE_PROVIDER_SITE_OTHER): Payer: Self-pay | Admitting: Internal Medicine

## 2018-01-01 ENCOUNTER — Ambulatory Visit (INDEPENDENT_AMBULATORY_CARE_PROVIDER_SITE_OTHER): Payer: BLUE CROSS/BLUE SHIELD | Admitting: Internal Medicine

## 2021-08-14 ENCOUNTER — Encounter (INDEPENDENT_AMBULATORY_CARE_PROVIDER_SITE_OTHER): Payer: Self-pay | Admitting: *Deleted
